# Patient Record
Sex: Male | Born: 2000 | Race: White | Hispanic: No | Marital: Single | Smoking: Never smoker
Health system: Southern US, Community
[De-identification: ages and names within clinical notes are randomized; demographics above are authoritative.]

## PROBLEM LIST (undated history)

## (undated) DIAGNOSIS — J309 Allergic rhinitis, unspecified: Secondary | ICD-10-CM

## (undated) HISTORY — DX: Allergic rhinitis, unspecified: J30.9

---
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
## 2022-07-15 ENCOUNTER — Telehealth: Payer: Self-pay

## 2022-07-15 NOTE — Telephone Encounter (Signed)
Prescription Request  07/15/2022  LOV: Visit date not found  What is the name of the medication or equipment?   clobetasol (TEMOVATE) 0.05 % external solution    Have you contacted your pharmacy to request a refill? Yes   Which pharmacy would you like this sent to?  [id number] - [place], [place] - [address] AT [address] [place] [address] Phone: [phone] Fax: [phone]    Patient notified that their request is being sent to the clinical staff for review and that they should receive a response within 2 business days.   Please advise at Mobile [phone] (mobile)
